# Patient Record
Sex: Female | Born: 1980 | Race: Black or African American | Hispanic: No | Marital: Single | Smoking: Never smoker
Health system: Southern US, Community
[De-identification: ages and names within clinical notes are randomized; demographics above are authoritative.]

---

## 2005-08-05 ENCOUNTER — Encounter: Payer: Self-pay | Admitting: Internal Medicine

## 2005-08-06 ENCOUNTER — Encounter: Payer: Self-pay | Admitting: Internal Medicine

## 2005-08-15 ENCOUNTER — Ambulatory Visit: Payer: Self-pay | Admitting: Hospitalist

## 2006-05-20 ENCOUNTER — Encounter: Payer: Self-pay | Admitting: Internal Medicine

## 2006-05-29 ENCOUNTER — Ambulatory Visit: Payer: Self-pay | Admitting: *Deleted

## 2006-05-29 DIAGNOSIS — R1900 Intra-abdominal and pelvic swelling, mass and lump, unspecified site: Secondary | ICD-10-CM | POA: Insufficient documentation

## 2006-07-24 ENCOUNTER — Ambulatory Visit (HOSPITAL_COMMUNITY): Admission: RE | Admit: 2006-07-24 | Discharge: 2006-07-24 | Payer: Self-pay | Admitting: Pulmonary Disease

## 2006-08-07 ENCOUNTER — Ambulatory Visit: Payer: Self-pay | Admitting: *Deleted

## 2006-08-07 ENCOUNTER — Encounter: Payer: Self-pay | Admitting: Internal Medicine

## 2006-08-07 LAB — CONVERTED CEMR LAB
ALT: 9 units/L (ref 0–35)
AST: 11 units/L (ref 0–37)
Basophils Absolute: 0 10*3/uL (ref 0.0–0.1)
Basophils Relative: 0 % (ref 0–1)
CO2: 22 meq/L (ref 19–32)
Chloride: 108 meq/L (ref 96–112)
Eosinophils Relative: 1 % (ref 0–5)
LDH: 138 units/L (ref 94–250)
Lymphocytes Relative: 37 % (ref 12–46)
Neutro Abs: 4.8 10*3/uL (ref 1.7–7.7)
Platelets: 279 10*3/uL (ref 150–400)
RDW: 13.6 % (ref 11.5–14.0)
Sodium: 140 meq/L (ref 135–145)
Total Bilirubin: 1.1 mg/dL (ref 0.3–1.2)
Total Protein: 7.7 g/dL (ref 6.0–8.3)

## 2006-08-22 ENCOUNTER — Telehealth: Payer: Self-pay | Admitting: Internal Medicine

## 2016-08-24 ENCOUNTER — Emergency Department (HOSPITAL_BASED_OUTPATIENT_CLINIC_OR_DEPARTMENT_OTHER): Payer: BLUE CROSS/BLUE SHIELD

## 2016-08-24 ENCOUNTER — Emergency Department (HOSPITAL_BASED_OUTPATIENT_CLINIC_OR_DEPARTMENT_OTHER)
Admission: EM | Admit: 2016-08-24 | Discharge: 2016-08-25 | Disposition: A | Discharge status: Home / Self Care | Payer: BLUE CROSS/BLUE SHIELD | Attending: Emergency Medicine | Admitting: Emergency Medicine

## 2016-08-24 ENCOUNTER — Encounter (HOSPITAL_BASED_OUTPATIENT_CLINIC_OR_DEPARTMENT_OTHER): Payer: Self-pay

## 2016-08-24 DIAGNOSIS — R2241 Localized swelling, mass and lump, right lower limb: Secondary | ICD-10-CM | POA: Insufficient documentation

## 2016-08-24 DIAGNOSIS — M79604 Pain in right leg: Secondary | ICD-10-CM | POA: Diagnosis not present

## 2016-08-24 DIAGNOSIS — M791 Myalgia, unspecified site: Secondary | ICD-10-CM

## 2016-08-24 DIAGNOSIS — M79601 Pain in right arm: Secondary | ICD-10-CM | POA: Insufficient documentation

## 2016-08-24 LAB — CBC WITH DIFFERENTIAL/PLATELET
BASOS ABS: 0 10*3/uL (ref 0.0–0.1)
Basophils Relative: 0 %
EOS PCT: 1 %
Eosinophils Absolute: 0.1 10*3/uL (ref 0.0–0.7)
HEMATOCRIT: 32.7 % — AB (ref 36.0–46.0)
Hemoglobin: 11.4 g/dL — ABNORMAL LOW (ref 12.0–15.0)
LYMPHS ABS: 3.3 10*3/uL (ref 0.7–4.0)
LYMPHS PCT: 42 %
MCH: 27.6 pg (ref 26.0–34.0)
MCHC: 34.9 g/dL (ref 30.0–36.0)
MCV: 79.2 fL (ref 78.0–100.0)
Monocytes Absolute: 0.5 10*3/uL (ref 0.1–1.0)
Monocytes Relative: 6 %
NEUTROS ABS: 4 10*3/uL (ref 1.7–7.7)
Neutrophils Relative %: 51 %
Platelets: 175 10*3/uL (ref 150–400)
RBC: 4.13 MIL/uL (ref 3.87–5.11)
RDW: 13.5 % (ref 11.5–15.5)
WBC: 7.9 10*3/uL (ref 4.0–10.5)

## 2016-08-24 LAB — BASIC METABOLIC PANEL
ANION GAP: 8 (ref 5–15)
BUN: 11 mg/dL (ref 6–20)
CHLORIDE: 106 mmol/L (ref 101–111)
CO2: 24 mmol/L (ref 22–32)
Calcium: 8.5 mg/dL — ABNORMAL LOW (ref 8.9–10.3)
Creatinine, Ser: 0.87 mg/dL (ref 0.44–1.00)
GFR calc Af Amer: >60 mL/min (ref >60)
GFR calc non Af Amer: >60 mL/min (ref >60)
GLUCOSE: 91 mg/dL (ref 65–99)
POTASSIUM: 3.7 mmol/L (ref 3.5–5.1)
Sodium: 138 mmol/L (ref 135–145)

## 2016-08-24 LAB — D-DIMER, QUANTITATIVE: D-Dimer, Quant: 0.51 ug/mL-FEU — ABNORMAL HIGH (ref 0.00–0.50)

## 2016-08-24 NOTE — ED Triage Notes (Signed)
C/o pain to right arm and right leg x 5 days-denies injury-NAD-steady gait

## 2016-08-24 NOTE — ED Notes (Signed)
Patient transported to Ultrasound 

## 2016-08-24 NOTE — ED Provider Notes (Signed)
[facility]   CSN: [id number] Arrival date & time: 08/24/16  1922  By signing my name below, I, Rosario Adie, attest that this documentation has been prepared under the direction and in the presence of Shaune Pollack, MD. Electronically Signed: Rosario Adie, ED Scribe. 08/24/16. 9:11 PM.  History   Chief Complaint Chief Complaint  Patient presents with  . Arm Pain  . Leg Pain   The history is provided by the patient. No language interpreter was used.    HPI Comments: Jeanette Patterson is a 36 y.o. female with no pertinent PMHx,  who presents to the Emergency Department complaining of persistent right arm and right leg pain beginning five days ago. Pt reports that she noticed a bruise over the area last week and over the past five days she has had aching pain to the entire right leg and since last night she has had pain to the right arm as well. Additionally, she reports that she noticed her right ankle was somewhat swollen today. Pt denies any known insect, arachnid, or tick bites. No h/o similar symptoms. No noted treatments for her symptoms were tried prior to coming into the ED. No h/o PE/DVT, recent long travel, surgery, fracture, prolonged immobilization, or exogenous estrogen usage. No recent medication changes or supplement usage. No recent activity in the woods. She denies neck pain, back pain, fever, chills, numbness, weakness, paraesthesias, abdominal pain, nausea, vomiting, or any other associated symptoms.   History reviewed. No pertinent past medical history.  Patient Active Problem List   Diagnosis Date Noted  . ABDOMINAL MASS 05/29/2006   History reviewed. No pertinent surgical history.  OB History    No data available     Home Medications    Prior to Admission medications   Medication Sig Start Date End Date Taking? Authorizing Provider  naproxen (NAPROSYN) 375 MG tablet Take 1 tablet (375 mg total) by mouth 2 (two) times  daily with a meal. 08/25/16 09/01/16  Shaune Pollack, MD   Family History No family history on file.  Social History Social History  Substance Use Topics  . Smoking status: Never Smoker  . Smokeless tobacco: Never Used  . Alcohol use No   Allergies   Patient has no known allergies.  Review of Systems Review of Systems  Constitutional: Negative for chills and fever.  Gastrointestinal: Negative for abdominal pain, nausea and vomiting.  Musculoskeletal: Positive for arthralgias, joint swelling and myalgias. Negative for back pain and neck pain.  Skin: Positive for color change.  Neurological: Negative for weakness and numbness.  All other systems reviewed and are negative.  Physical Exam Updated Vital Signs BP 118/76 (BP Location: Right Arm)   Pulse 77   Temp 98.7 F (37.1 C) (Oral)   Resp 16   Ht 5\' 3"  (1.6 m)   Wt 63 kg (139 lb)   SpO2 100%   BMI 24.62 kg/m   Physical Exam  Constitutional: She appears well-developed and well-nourished. No distress.  HENT:  Head: Normocephalic and atraumatic.  Eyes: Conjunctivae are normal.  Neck: Normal range of motion.  Cardiovascular: Normal rate, regular rhythm and normal heart sounds.   No murmur heard. Pulmonary/Chest: Effort normal and breath sounds normal. No respiratory distress. She has no wheezes. She has no rales.  Abdominal: Soft. She exhibits no distension. There is no tenderness. There is no rebound and no guarding.  Musculoskeletal: Normal range of motion.  No joint swelling or erythema. Full painless ROM  of the BLE and BUE.   Neurological: She is alert.  Neurological Exam:  Mental Status: Alert and oriented to person, place, and time. Attention and concentration normal. Speech clear. Recent memory is intact. Cranial Nerves: Visual fields grossly intact. EOMI and PERRLA. No nystagmus noted. Facial sensation intact at forehead, maxillary cheek, and chin/mandible bilaterally. No facial asymmetry or weakness. Hearing  grossly normal. Uvula is midline, and palate elevates symmetrically. Normal SCM and trapezius strength. Tongue midline without fasciculations. Motor: Muscle strength 5/5 in proximal and distal UE and LE bilaterally. No pronator drift. Muscle tone normal. Reflexes: 2+ and symmetrical in all four extremities.  Sensation: Intact to light touch in upper and lower extremities distally bilaterally.  Gait: Normal without ataxia. Coordination: Normal FTN bilaterally.  Skin: No pallor.  Multiple diffuse hyperpigmented lesions and scars to BLE and arms. No lesions on the palms or soles.   Psychiatric: She has a normal mood and affect. Her behavior is normal.  Nursing note and vitals reviewed.  ED Treatments / Results  DIAGNOSTIC STUDIES: Oxygen Saturation is 100% on RA, normal by my interpretation.   COORDINATION OF CARE: 9:11 PM-Discussed next steps with pt. Pt verbalized understanding and is agreeable with the plan.   Labs (all labs ordered are listed, but only abnormal results are displayed) Labs Reviewed  CBC WITH DIFFERENTIAL/PLATELET - Abnormal; Notable for the following:       Result Value   Hemoglobin 11.4 (*)    HCT 32.7 (*)    All other components within normal limits  BASIC METABOLIC PANEL - Abnormal; Notable for the following:    Calcium 8.5 (*)    All other components within normal limits  D-DIMER, QUANTITATIVE (NOT AT [facility]) - Abnormal; Notable for the following:    D-Dimer, Quant 0.51 (*)    All other components within normal limits    EKG  EKG Interpretation None      Radiology [place] Venous Img Lower Unilateral Right  Result Date: 08/24/2016 CLINICAL DATA:  Initial evaluation for right leg pain for 1 week. EXAM: Right LOWER EXTREMITY VENOUS DOPPLER ULTRASOUND TECHNIQUE: Gray-scale sonography with graded compression, as well as color Doppler and duplex ultrasound were performed to evaluate the lower extremity deep venous systems from the level of the common femoral vein  and including the common femoral, femoral, profunda femoral, popliteal and calf veins including the posterior tibial, peroneal and gastrocnemius veins when visible. The superficial great saphenous vein was also interrogated. Spectral Doppler was utilized to evaluate flow at rest and with distal augmentation maneuvers in the common femoral, femoral and popliteal veins. COMPARISON:  None. FINDINGS: Contralateral Common Femoral Vein: Respiratory phasicity is normal and symmetric with the symptomatic side. No evidence of thrombus. Normal compressibility. Common Femoral Vein: No evidence of thrombus. Normal compressibility, respiratory phasicity and response to augmentation. Saphenofemoral Junction: No evidence of thrombus. Normal compressibility and flow on color Doppler imaging. Profunda Femoral Vein: No evidence of thrombus. Normal compressibility and flow on color Doppler imaging. Femoral Vein: No evidence of thrombus. Normal compressibility, respiratory phasicity and response to augmentation. Popliteal Vein: No evidence of thrombus. Normal compressibility, respiratory phasicity and response to augmentation. Calf Veins: No evidence of thrombus. Normal compressibility and flow on color Doppler imaging. Superficial Great Saphenous Vein: No evidence of thrombus. Normal compressibility and flow on color Doppler imaging. Venous Reflux:  None. Other Findings:  None. IMPRESSION: No evidence of DVT within the right lower extremity. Electronically Signed   By: Janell QuietBenjamin  McClintock M.D.  On: 08/24/2016 23:37   [place] Venous Img Upper Uni Right  Result Date: 08/24/2016 CLINICAL DATA:  Initial evaluation for right arm pain for 1 week. EXAM: Right UPPER EXTREMITY VENOUS DOPPLER ULTRASOUND TECHNIQUE: Gray-scale sonography with graded compression, as well as color Doppler and duplex ultrasound were performed to evaluate the upper extremity deep venous system from the level of the subclavian vein and including the jugular, axillary,  basilic, radial, ulnar and upper cephalic vein. Spectral Doppler was utilized to evaluate flow at rest and with distal augmentation maneuvers. COMPARISON:  None. FINDINGS: Contralateral Subclavian Vein: Respiratory phasicity is normal and symmetric with the symptomatic side. No evidence of thrombus. Normal compressibility. Internal Jugular Vein: No evidence of thrombus. Normal compressibility, respiratory phasicity and response to augmentation. Subclavian Vein: No evidence of thrombus. Normal compressibility, respiratory phasicity and response to augmentation. Axillary Vein: No evidence of thrombus. Normal compressibility, respiratory phasicity and response to augmentation. Cephalic Vein: No evidence of thrombus. Normal compressibility, respiratory phasicity and response to augmentation. Basilic Vein: No evidence of thrombus. Normal compressibility, respiratory phasicity and response to augmentation. Brachial Veins: No evidence of thrombus. Normal compressibility, respiratory phasicity and response to augmentation. Radial Veins: No evidence of thrombus. Normal compressibility, respiratory phasicity and response to augmentation. Ulnar Veins: No evidence of thrombus. Normal compressibility, respiratory phasicity and response to augmentation. Venous Reflux:  None visualized. Other Findings:  None visualized. IMPRESSION: No evidence of DVT within the right upper extremity. Electronically Signed   By: Rise MuBenjamin  McClintock M.D.   On: 08/24/2016 23:38    Procedures Procedures   Medications Ordered in ED Medications - No data to display  Initial Impression / Assessment and Plan / ED Course  I have reviewed the triage vital signs and the nursing notes.  Pertinent labs & imaging results that were available during my care of the patient were reviewed by me and considered in my medical decision making (see chart for details).     36 yo F here with right-sided arthralgias and subjective leg swelling. Exam as  above, largely unremarkable. May be 2/2 MSK pain. CBC, BMP unremarkable. Pt did recently give birth however and D-Dimer positive, so [place] obtained of RUE and RLE and is negative. Doubt PE. Peripheral pulses and neuro function/sensation are normal. Will d/c with supportive care, return precautions.  Final Clinical Impressions(s) / ED Diagnoses   Final diagnoses:  Right arm pain  Right leg pain  Myalgia   New Prescriptions Discharge Medication List as of 08/25/2016 12:04 AM    START taking these medications   Details  naproxen (NAPROSYN) 375 MG tablet Take 1 tablet (375 mg total) by mouth 2 (two) times daily with a meal., Starting Sat 08/25/2016, Until Sat 09/01/2016, Print        I personally performed the services described in this documentation, which was scribed in my presence. The recorded information has been reviewed and is accurate.    Shaune PollackIsaacs, Tarry Blayney, MD 08/25/16 [phone]

## 2016-08-25 MED ORDER — NAPROXEN 375 MG PO TABS: 375.0000 mg | ORAL_TABLET | Freq: Two times a day (BID) | ORAL | 0 refills | Status: AC

## 2016-08-25 NOTE — ED Notes (Signed)
Pt is breastfeeding, MD made aware. Pt is to use ibuprofen 600mg  every 8 hours for pain per Dr. Erma HeritageIsaacs. Pt verbalized understanding.

## 2016-08-25 NOTE — ED Notes (Signed)
ED Provider at bedside. 

## 2019-06-22 IMAGING — US US EXTREM LOW VENOUS*R*
1 series · 13 of 24 positions shown · non-contrast
Comparison: None.

CLINICAL DATA: Initial evaluation for right leg pain for 1 week.



[Series 1: us extrem low venous*right* · 0.05mm/px · 13 of 26 slices shown]
[im 1/26]
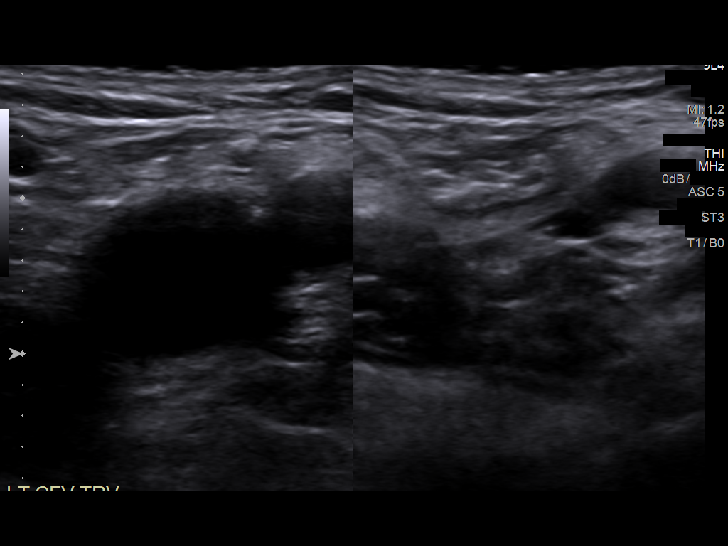
[im 3/26]
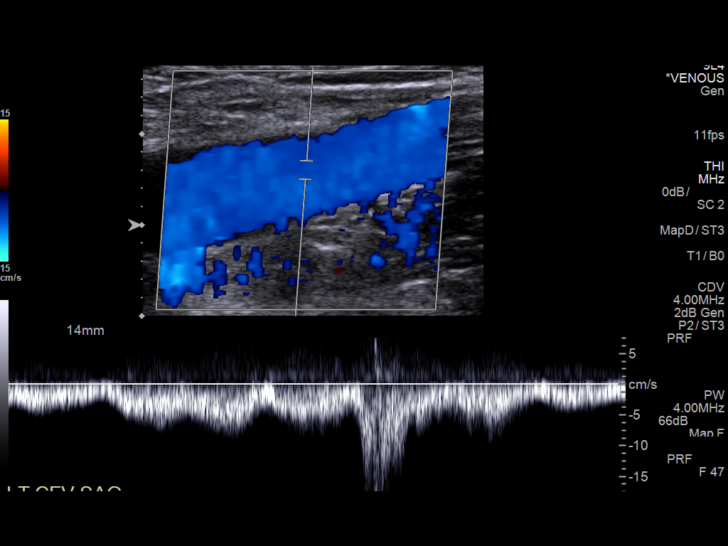
[im 5/26]
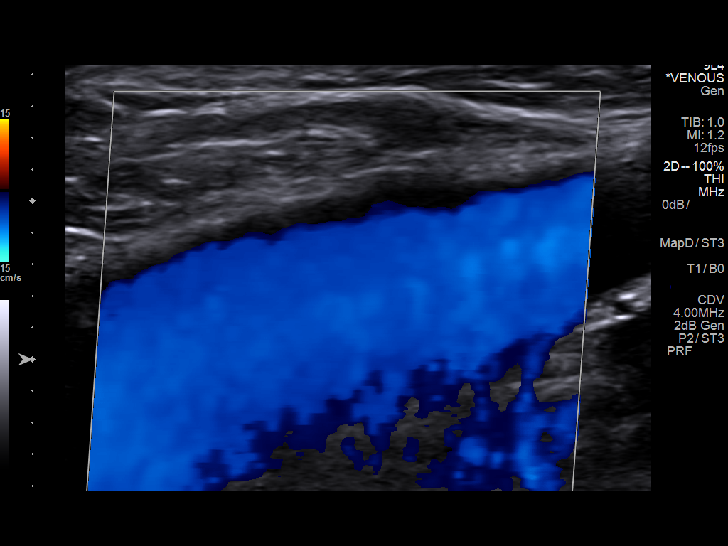
[im 7/26]
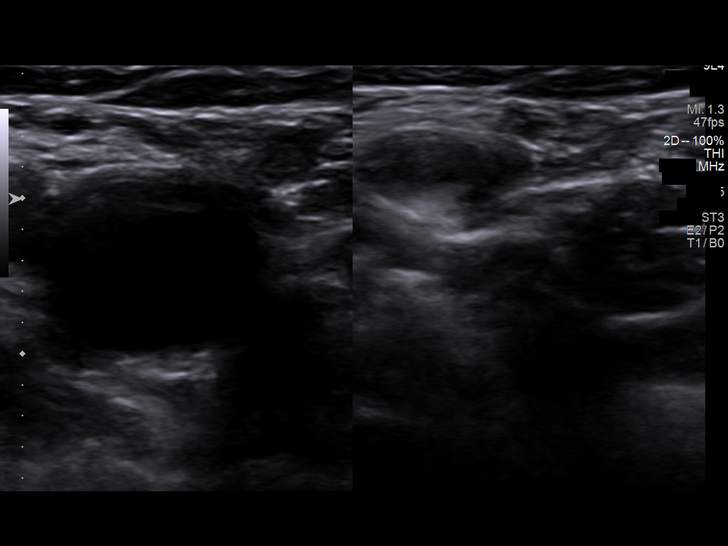
[im 9/26]
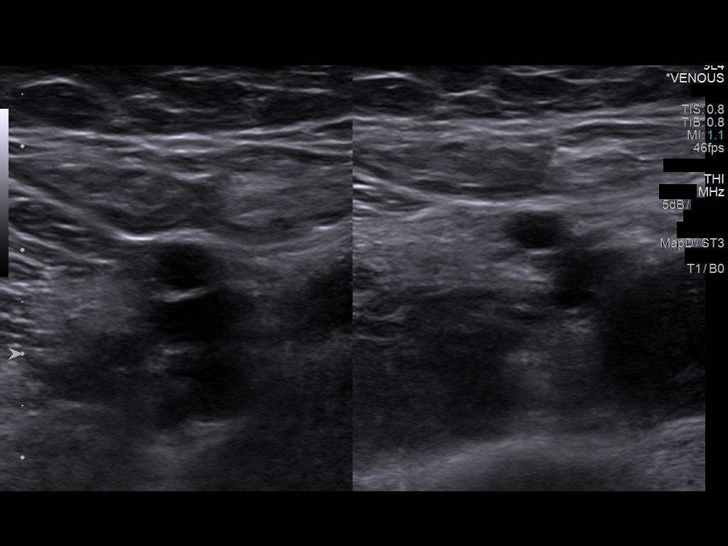
[im 11/26]
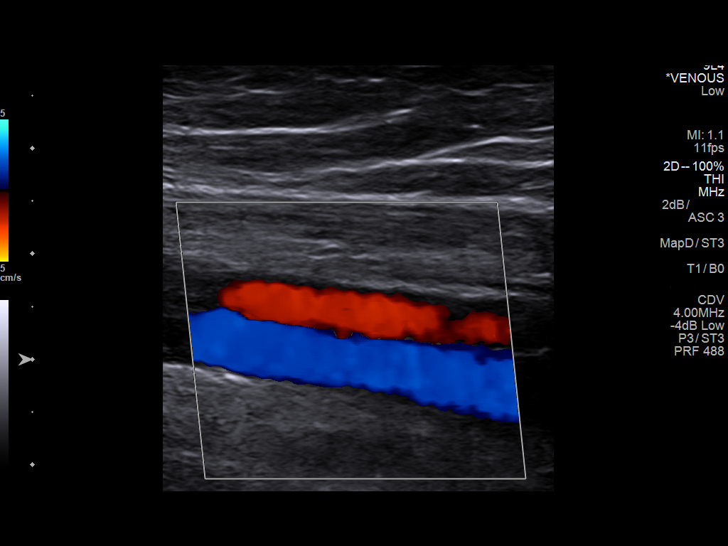
[im 14/26]
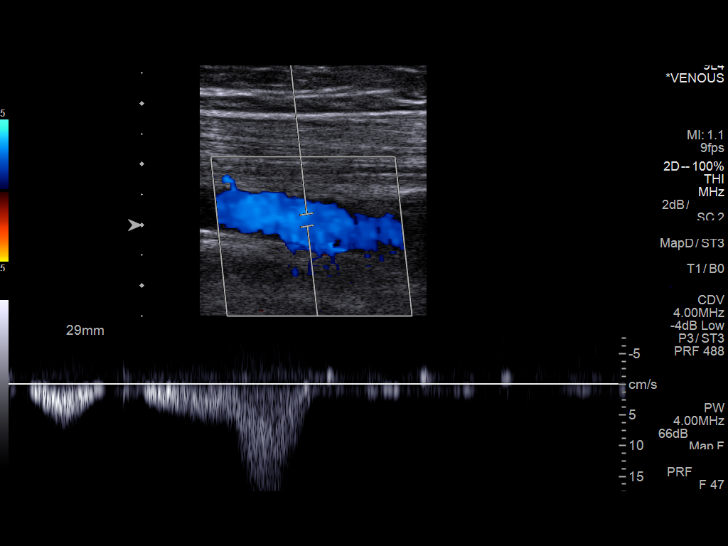
[im 15/26]
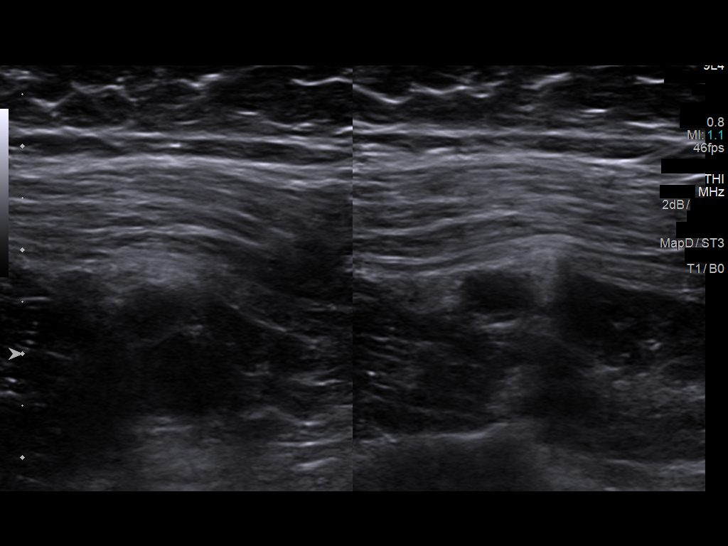
[im 17/26]
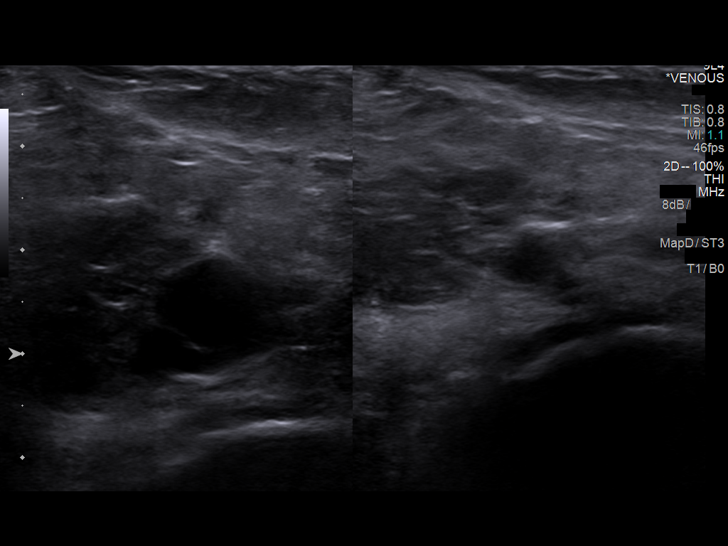
[im 19/26]
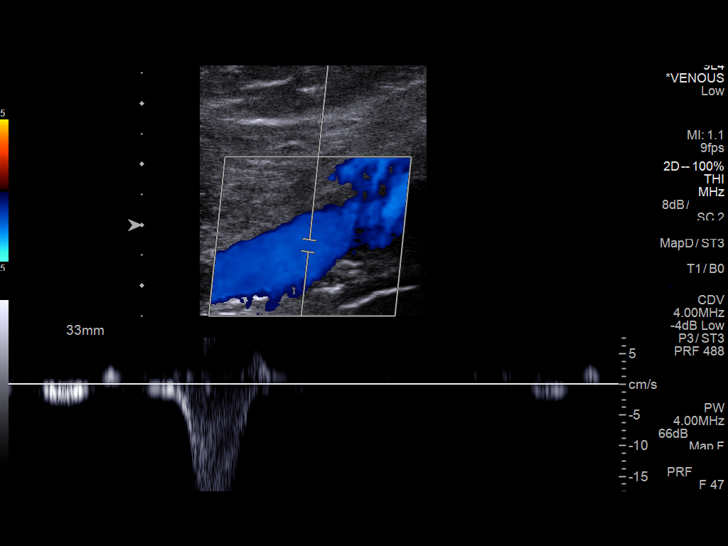
[im 21/26]
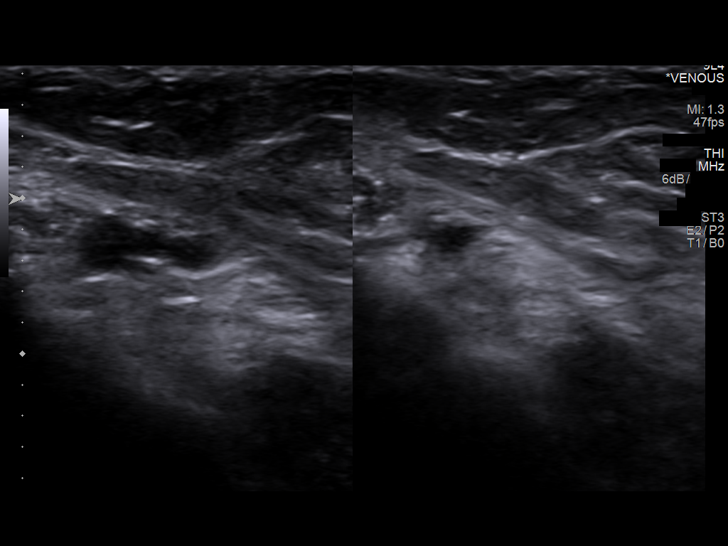
[im 23/26]
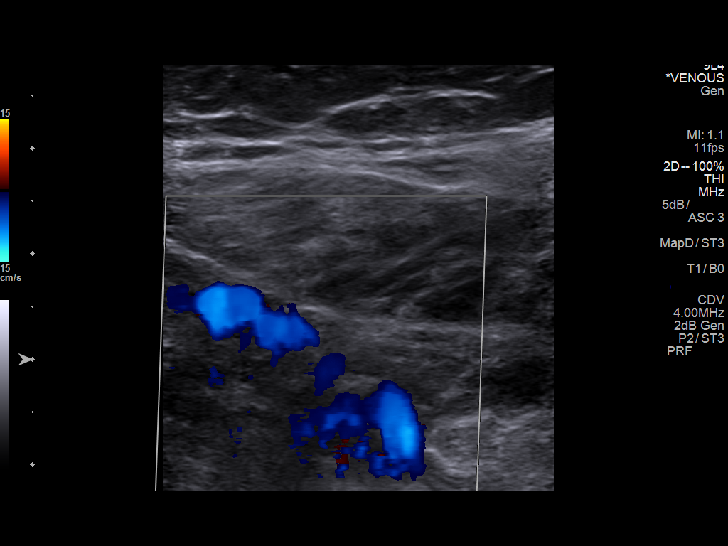
[im 26/26]
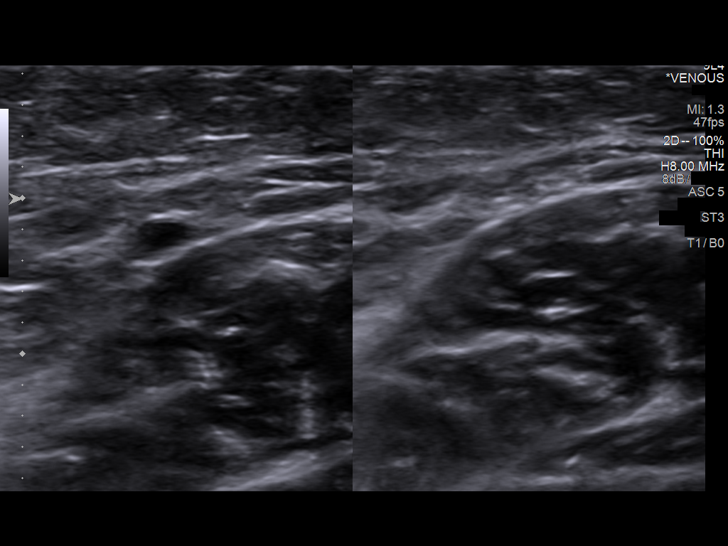

[13 of 24 positions shown; findings below may reference images not displayed]

FINDINGS: Contralateral Common Femoral Vein: Respiratory phasicity is normal
and symmetric with the symptomatic side. No evidence of thrombus.
Normal compressibility.

Common Femoral Vein: No evidence of thrombus. Normal
compressibility, respiratory phasicity and response to augmentation.

Saphenofemoral Junction: No evidence of thrombus. Normal
compressibility and flow on color Doppler imaging.

Profunda Femoral Vein: No evidence of thrombus. Normal
compressibility and flow on color Doppler imaging.

Femoral Vein: No evidence of thrombus. Normal compressibility,
respiratory phasicity and response to augmentation.

Popliteal Vein: No evidence of thrombus. Normal compressibility,
respiratory phasicity and response to augmentation.

Calf Veins: No evidence of thrombus. Normal compressibility and flow
on color Doppler imaging.

Superficial Great Saphenous Vein: No evidence of thrombus. Normal
compressibility and flow on color Doppler imaging.

Venous Reflux:  None.

Other Findings:  None.
IMPRESSION: No evidence of DVT within the right lower extremity.

## 2019-06-22 IMAGING — US US EXTREM  UP VENOUS*R*
1 series · 13 of 24 positions shown · non-contrast
Comparison: None.

CLINICAL DATA: Initial evaluation for right arm pain for 1 week.



[Series 1: us extrem up venous*right* · 0.06mm/px · 13 of 32 slices shown]
[im 1/32]
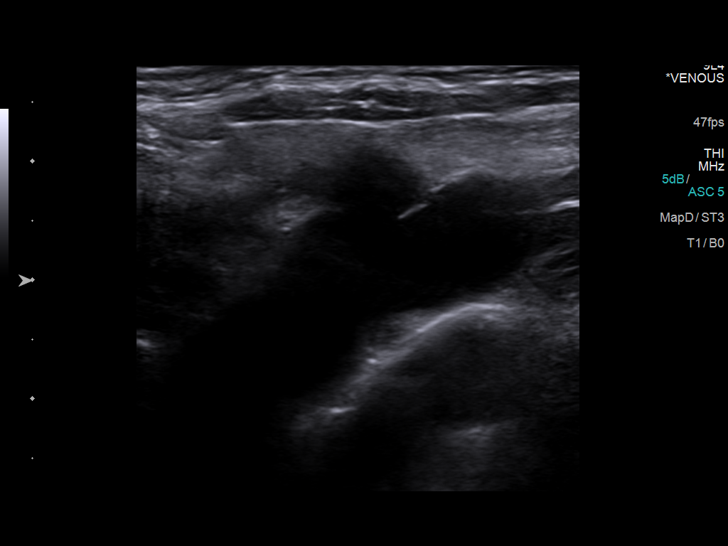
[im 3/32]
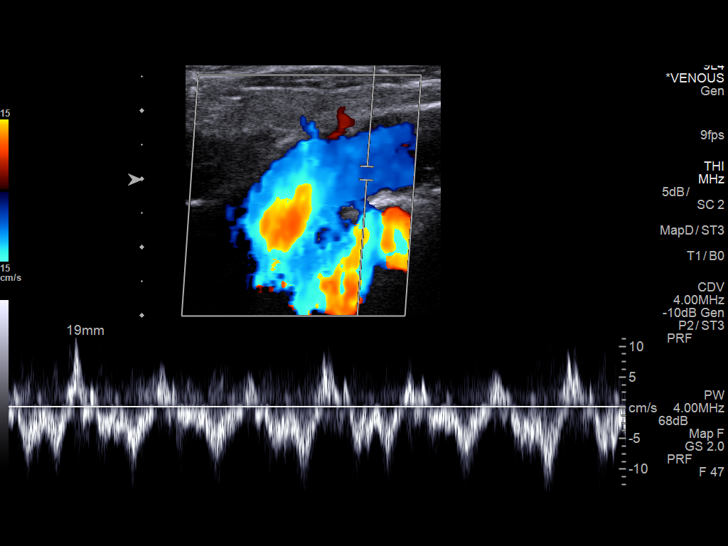
[im 6/32]
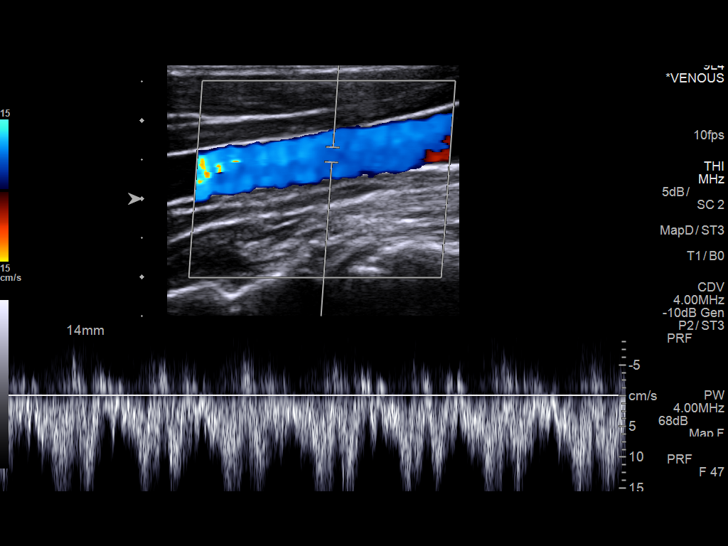
[im 9/32]
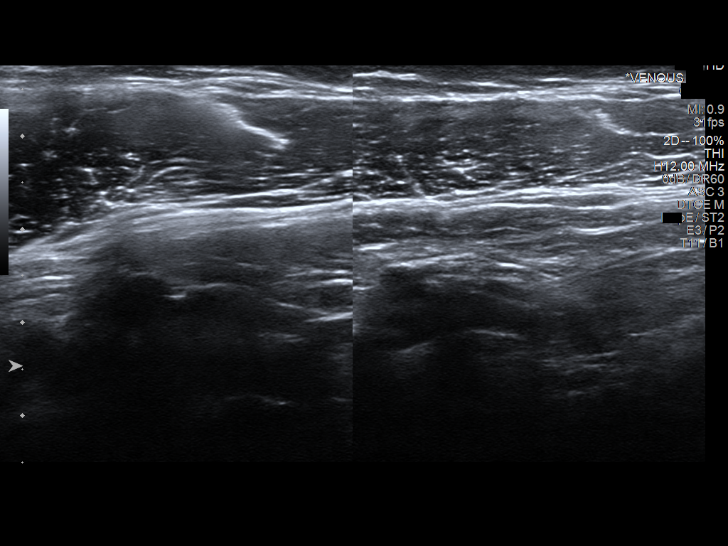
[im 11/32]
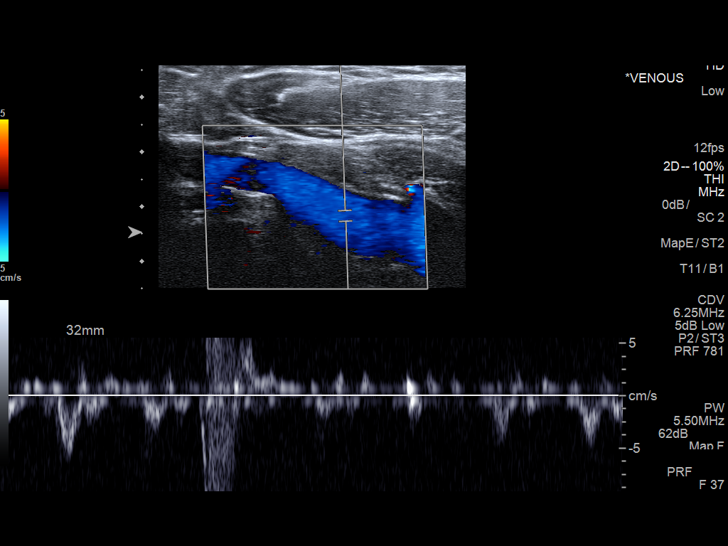
[im 14/32]
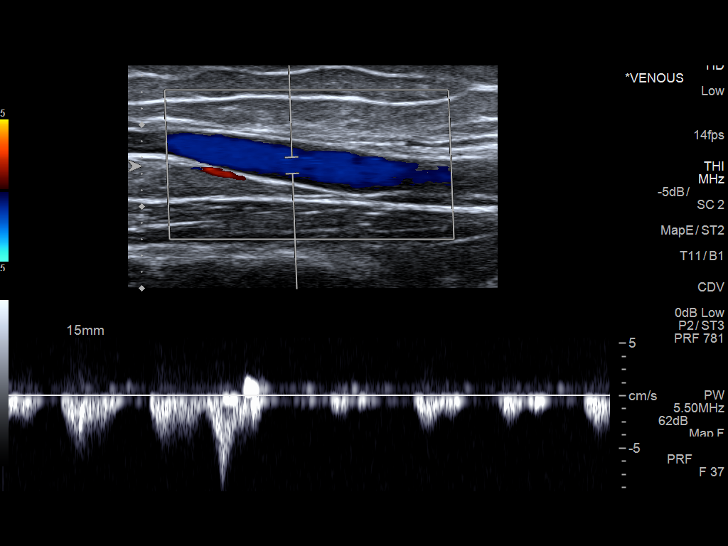
[im 17/32]
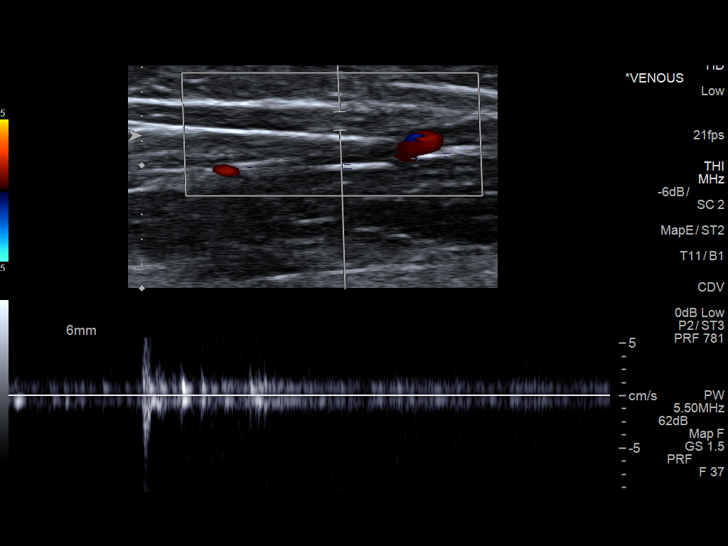
[im 18/32]
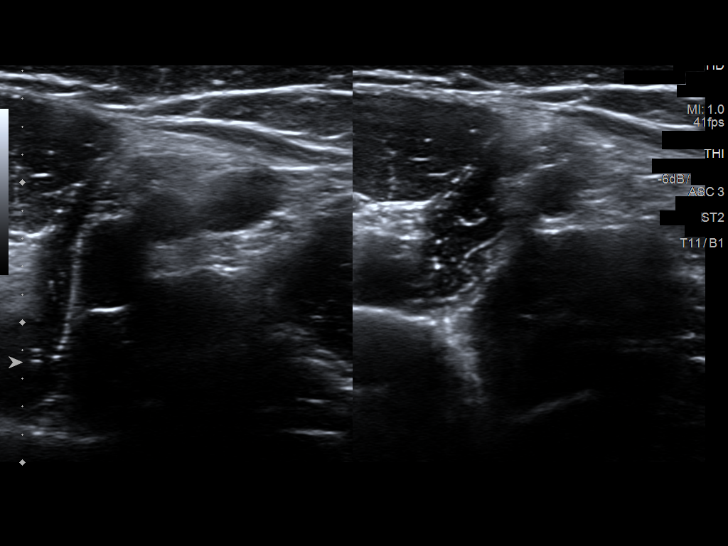
[im 21/32]
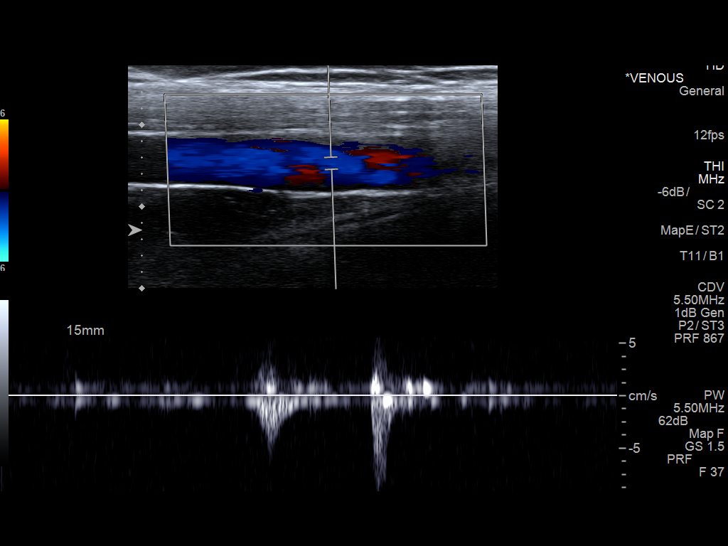
[im 23/32]
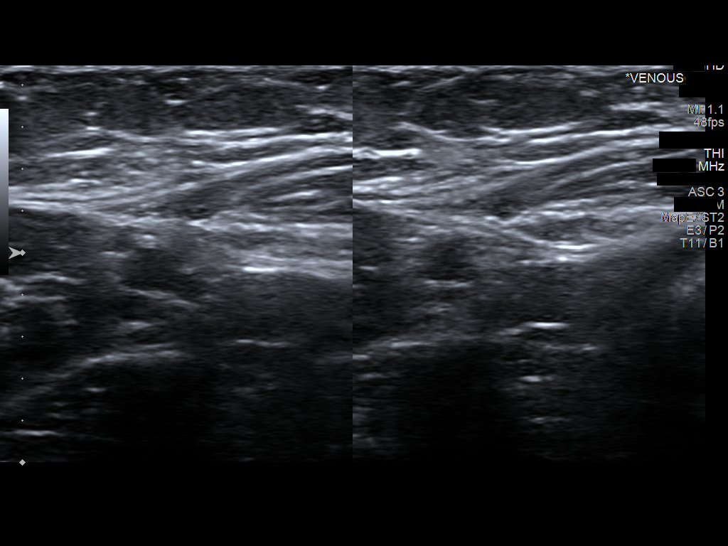
[im 26/32]
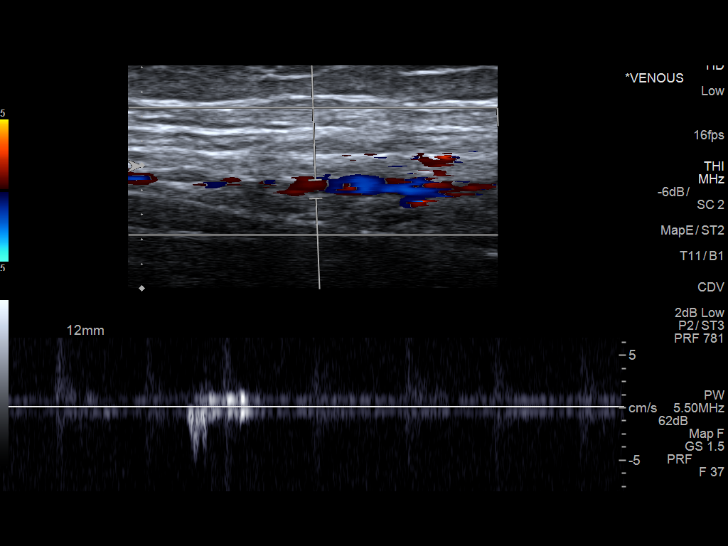
[im 29/32]
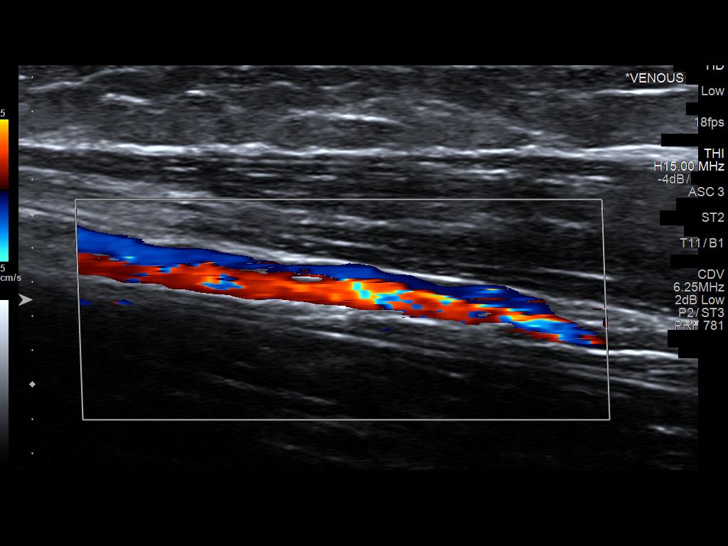
[im 32/32]
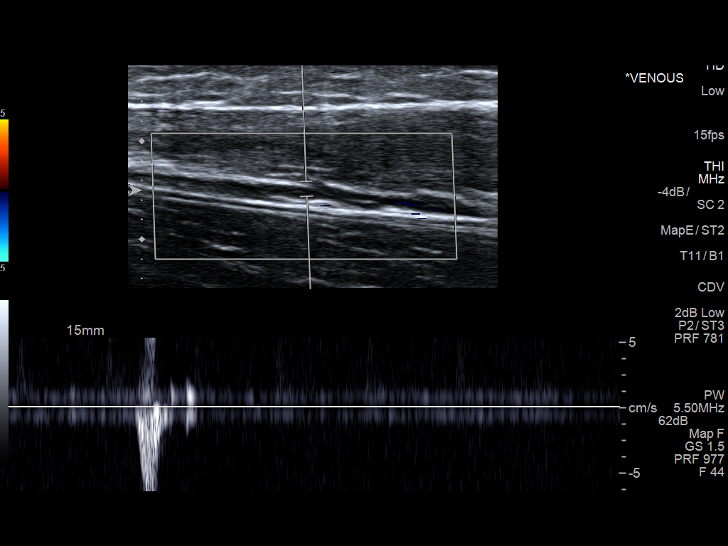

[13 of 24 positions shown; findings below may reference images not displayed]

FINDINGS: Contralateral Subclavian Vein: Respiratory phasicity is normal and
symmetric with the symptomatic side. No evidence of thrombus. Normal
compressibility.

Internal Jugular Vein: No evidence of thrombus. Normal
compressibility, respiratory phasicity and response to augmentation.

Subclavian Vein: No evidence of thrombus. Normal compressibility,
respiratory phasicity and response to augmentation.

Axillary Vein: No evidence of thrombus. Normal compressibility,
respiratory phasicity and response to augmentation.

Cephalic Vein: No evidence of thrombus. Normal compressibility,
respiratory phasicity and response to augmentation.

Basilic Vein: No evidence of thrombus. Normal compressibility,
respiratory phasicity and response to augmentation.

Brachial Veins: No evidence of thrombus. Normal compressibility,
respiratory phasicity and response to augmentation.

Radial Veins: No evidence of thrombus. Normal compressibility,
respiratory phasicity and response to augmentation.

Ulnar Veins: No evidence of thrombus. Normal compressibility,
respiratory phasicity and response to augmentation.

Venous Reflux:  None visualized.

Other Findings:  None visualized.
IMPRESSION: No evidence of DVT within the right upper extremity.

## 2021-04-14 ENCOUNTER — Other Ambulatory Visit: Payer: Self-pay

## 2021-04-14 ENCOUNTER — Encounter (HOSPITAL_BASED_OUTPATIENT_CLINIC_OR_DEPARTMENT_OTHER): Payer: Self-pay

## 2021-04-14 ENCOUNTER — Emergency Department (HOSPITAL_BASED_OUTPATIENT_CLINIC_OR_DEPARTMENT_OTHER): Payer: 59

## 2021-04-14 ENCOUNTER — Emergency Department (HOSPITAL_BASED_OUTPATIENT_CLINIC_OR_DEPARTMENT_OTHER)
Admission: EM | Admit: 2021-04-14 | Discharge: 2021-04-14 | Disposition: A | Discharge status: Home / Self Care | Payer: 59 | Attending: Emergency Medicine | Admitting: Emergency Medicine

## 2021-04-14 DIAGNOSIS — R1084 Generalized abdominal pain: Secondary | ICD-10-CM | POA: Insufficient documentation

## 2021-04-14 LAB — URINALYSIS, MICROSCOPIC (REFLEX)

## 2021-04-14 LAB — CBC WITH DIFFERENTIAL/PLATELET
Abs Immature Granulocytes: 0.01 10*3/uL (ref 0.00–0.07)
Basophils Absolute: 0 10*3/uL (ref 0.0–0.1)
Basophils Relative: 0 %
Eosinophils Absolute: 0.1 10*3/uL (ref 0.0–0.5)
Eosinophils Relative: 1 %
HCT: 32.7 % — ABNORMAL LOW (ref 36.0–46.0)
Hemoglobin: 11.1 g/dL — ABNORMAL LOW (ref 12.0–15.0)
Immature Granulocytes: 0 %
Lymphocytes Relative: 37 %
Lymphs Abs: 2.6 10*3/uL (ref 0.7–4.0)
MCH: 26.2 pg (ref 26.0–34.0)
MCHC: 33.9 g/dL (ref 30.0–36.0)
MCV: 77.1 fL — ABNORMAL LOW (ref 80.0–100.0)
Monocytes Absolute: 0.4 10*3/uL (ref 0.1–1.0)
Monocytes Relative: 5 %
Neutro Abs: 4 10*3/uL (ref 1.7–7.7)
Neutrophils Relative %: 57 %
Platelets: 227 10*3/uL (ref 150–400)
RBC: 4.24 MIL/uL (ref 3.87–5.11)
RDW: 13.5 % (ref 11.5–15.5)
Smear Review: NORMAL
WBC: 7.1 10*3/uL (ref 4.0–10.5)
nRBC: 0 % (ref 0.0–0.2)

## 2021-04-14 LAB — URINALYSIS, ROUTINE W REFLEX MICROSCOPIC
Bilirubin Urine: NEGATIVE
Glucose, UA: NEGATIVE mg/dL
Ketones, ur: NEGATIVE mg/dL
Leukocytes,Ua: NEGATIVE
Nitrite: NEGATIVE
Protein, ur: NEGATIVE mg/dL
Specific Gravity, Urine: 1.01 (ref 1.005–1.030)
pH: 6.5 (ref 5.0–8.0)

## 2021-04-14 LAB — COMPREHENSIVE METABOLIC PANEL
ALT: 26 U/L (ref 0–44)
AST: 22 U/L (ref 15–41)
Albumin: 3.4 g/dL — ABNORMAL LOW (ref 3.5–5.0)
Alkaline Phosphatase: 45 U/L (ref 38–126)
Anion gap: 9 (ref 5–15)
BUN: 8 mg/dL (ref 6–20)
CO2: 24 mmol/L (ref 22–32)
Calcium: 8.8 mg/dL — ABNORMAL LOW (ref 8.9–10.3)
Chloride: 105 mmol/L (ref 98–111)
Creatinine, Ser: 0.79 mg/dL (ref 0.44–1.00)
GFR, Estimated: >60 mL/min (ref >60)
Glucose, Bld: 88 mg/dL (ref 70–99)
Potassium: 3.1 mmol/L — ABNORMAL LOW (ref 3.5–5.1)
Sodium: 138 mmol/L (ref 135–145)
Total Bilirubin: 0.8 mg/dL (ref 0.3–1.2)
Total Protein: 7.5 g/dL (ref 6.5–8.1)

## 2021-04-14 LAB — PREGNANCY, URINE: Preg Test, Ur: NEGATIVE

## 2021-04-14 LAB — LIPASE, BLOOD: Lipase: 36 U/L (ref 11–51)

## 2021-04-14 MED ORDER — ONDANSETRON 4 MG PO TBDP: 4.0000 mg | ORAL_TABLET | Freq: Three times a day (TID) | ORAL | 0 refills | Status: AC | PRN

## 2021-04-14 MED ORDER — IOHEXOL 350 MG/ML SOLN
100.0000 mL | Freq: Once | INTRAVENOUS | Status: AC | PRN
  Administered 2021-04-14: 100 mL via INTRAVENOUS

## 2021-04-14 MED ORDER — ONDANSETRON HCL 4 MG/2ML IJ SOLN: 4.0000 mg | Freq: Once | INTRAMUSCULAR | Status: DC

## 2021-04-14 MED ORDER — DICYCLOMINE HCL 20 MG PO TABS: 20.0000 mg | ORAL_TABLET | Freq: Two times a day (BID) | ORAL | 0 refills | Status: AC

## 2021-04-14 MED ORDER — POTASSIUM CHLORIDE CRYS ER 20 MEQ PO TBCR: 40.0000 meq | EXTENDED_RELEASE_TABLET | Freq: Two times a day (BID) | ORAL | 0 refills | Status: AC

## 2021-04-14 MED ORDER — SODIUM CHLORIDE 0.9 % IV BOLUS: 1000.0000 mL | Freq: Once | INTRAVENOUS | Status: DC

## 2021-04-14 NOTE — ED Triage Notes (Signed)
Pt c/o right side abd pain, n/d x 6 days-NAD-steady gait ?

## 2021-04-14 NOTE — ED Provider Notes (Signed)
?[facility] EMERGENCY DEPARTMENT ?Provider Note ? ? ?CSN: [id number] ?Arrival date & time: 04/14/21  1608 ? ?  ? ?History ? ?Chief Complaint  ?Patient presents with  ? Abdominal Pain  ? ? ?Jeanette Patterson is a 41 y.o. female. ? ?41 year old female presents today for evaluation of right-sided abdominal pain onset Saturday.  Patient reports its been constant since.  Denies any alleviating or aggravating factors.  She has not tried anything prior to her arrival.  She endorses nausea however denies vomiting.  She denies any changes to her bowel habits.  She denies any recent illness.  She states she has not been sexually active for at least a month.  Denies any new vaginal discharge or vaginal bleeding.  She denies dysuria.  Denies prior abdominal surgeries. ? ?The history is provided by the patient. No language interpreter was used.  ? ?  ? ?Home Medications ?Prior to Admission medications   ?Not on File  ?   ? ?Allergies    ?Patient has no known allergies.   ? ?Review of Systems   ?Review of Systems  ?Constitutional:  Negative for activity change, chills and fever.  ?Respiratory:  Negative for shortness of breath.   ?Cardiovascular:  Negative for chest pain.  ?Gastrointestinal:  Positive for abdominal pain and nausea. Negative for abdominal distention and vomiting.  ?Genitourinary:  Negative for dysuria.  ?Neurological:  Negative for weakness and light-headedness.  ?All other systems reviewed and are negative. ? ?Physical Exam ?Updated Vital Signs ?BP (!) 153/98 (BP Location: Left Arm)   Pulse 88   Temp 98.9 ?F (37.2 ?C) (Oral)   Resp 20   Ht 5\' 3"  (1.6 m)   Wt 68.5 kg   LMP 04/06/2021   SpO2 100%   BMI 26.75 kg/m?  ?Physical Exam ?Vitals and nursing note reviewed.  ?Constitutional:   ?   General: She is not in acute distress. ?   Appearance: Normal appearance. She is well-developed. She is not ill-appearing.  ?HENT:  ?   Head: Normocephalic and atraumatic.  ?   Nose: Nose normal.  ?Eyes:  ?    General: No scleral icterus. ?   Extraocular Movements: Extraocular movements intact.  ?   Conjunctiva/sclera: Conjunctivae normal.  ?Cardiovascular:  ?   Rate and Rhythm: Normal rate and regular rhythm.  ?   Pulses: Normal pulses.  ?   Heart sounds: Normal heart sounds.  ?Pulmonary:  ?   Effort: Pulmonary effort is normal. No respiratory distress.  ?   Breath sounds: Normal breath sounds. No wheezing or rales.  ?Abdominal:  ?   General: There is no distension.  ?   Palpations: Abdomen is soft.  ?   Tenderness: There is no abdominal tenderness. There is no right CVA tenderness, left CVA tenderness or guarding.  ?Musculoskeletal:     ?   General: Normal range of motion.  ?   Cervical back: Normal range of motion.  ?Skin: ?   General: Skin is warm and dry.  ?Neurological:  ?   General: No focal deficit present.  ?   Mental Status: She is alert. Mental status is at baseline.  ? ? ?ED Results / Procedures / Treatments   ?Labs ?(all labs ordered are listed, but only abnormal results are displayed) ?Labs Reviewed  ?COMPREHENSIVE METABOLIC PANEL - Abnormal; Notable for the following components:  ?    Result Value  ? Potassium 3.1 (*)   ? Calcium 8.8 (*)   ? Albumin  3.4 (*)   ? All other components within normal limits  ?URINALYSIS, ROUTINE W REFLEX MICROSCOPIC - Abnormal; Notable for the following components:  ? Hgb urine dipstick TRACE (*)   ? All other components within normal limits  ?URINALYSIS, MICROSCOPIC (REFLEX) - Abnormal; Notable for the following components:  ? Bacteria, UA FEW (*)   ? All other components within normal limits  ?CBC WITH DIFFERENTIAL/PLATELET - Abnormal; Notable for the following components:  ? Hemoglobin 11.1 (*)   ? HCT 32.7 (*)   ? MCV 77.1 (*)   ? All other components within normal limits  ?LIPASE, BLOOD  ?PREGNANCY, URINE  ? ? ?EKG ?None ? ?Radiology ?No results found. ? ?Procedures ?Procedures  ? ? ?Medications Ordered in ED ?Medications  ?sodium chloride 0.9 % bolus 1,000 mL (has no  administration in time range)  ?ondansetron (ZOFRAN) injection 4 mg (has no administration in time range)  ? ? ?ED Course/ Medical Decision Making/ A&P ?  ?                        ?Medical Decision Making ?Amount and/or Complexity of Data Reviewed ?Labs: ordered. ?Radiology: ordered. ? ?Risk ?Prescription drug management. ? ? ?Medical Decision Making / ED Course ? ? ?This patient presents to the ED for concern of abdominal pain, this involves an extensive number of treatment options, and is a complaint that carries with it a high risk of complications and morbidity.  The differential diagnosis includes appendicitis, cholecystitis, pancreatitis, diverticulitis, colitis ? ?MDM: ?41 year old female presents today for evaluation of right-sided abdominal pain onset Saturday.  Its been constant without any alleviating or aggravating factors.  She has not taken anything prior to arrival.  Denies any changes to her bowel habits.  Endorses nausea without vomiting.  Abdomen is nondistended and nontender.  Work-up is without leukocytosis.  Potassium 3.1.  Will replete with 40 mEq p.o. potassium.  UA without UTI.  CT abdomen pelvis ordered.  IV fluids, Zofran ordered. ?CT abdomen pelvis without acute intra-abdominal process.  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  Patient is aware of this as well and states it was also visible on a recent ultrasound done by PCP.  She has not received any diagnosis or any official work-up from the standpoint.  She reports some improvement in her symptoms.  Patient is appropriate for discharge.  Discharged in stable condition.  Return precautions discussed. ? ? ?Lab Tests: ?-I ordered, reviewed, and interpreted labs.   ?The pertinent results include:   ?Labs Reviewed  ?COMPREHENSIVE METABOLIC PANEL - Abnormal; Notable for the following components:  ?    Result Value  ? Potassium 3.1 (*)   ? Calcium  8.8 (*)   ? Albumin 3.4 (*)   ? All other components within normal limits  ?URINALYSIS, ROUTINE W REFLEX MICROSCOPIC - Abnormal; Notable for the following components:  ? Hgb urine dipstick TRACE (*)   ? All other components within normal limits  ?URINALYSIS, MICROSCOPIC (REFLEX) - Abnormal; Notable for the following components:  ? Bacteria, UA FEW (*)   ? All other components within normal limits  ?CBC WITH DIFFERENTIAL/PLATELET - Abnormal; Notable for the following components:  ? Hemoglobin 11.1 (*)   ? HCT 32.7 (*)   ? MCV 77.1 (*)   ? All other components within normal limits  ?LIPASE, BLOOD  ?PREGNANCY, URINE  ?  ? ? ?  EKG ? EKG Interpretation ? ?Date/Time:    ?Ventricular Rate:    ?PR Interval:    ?QRS Duration:   ?QT Interval:    ?QTC Calculation:   ?R Axis:     ?Text Interpretation:   ?  ? ?  ? ? ? ?Imaging Studies ordered: ?I ordered imaging studies including CT abdomen pelvis with contrast ?I independently visualized and interpreted imaging. ?I agree with the radiologist interpretation ? ? ?Medicines ordered and prescription drug management: ?Meds ordered this encounter  ?Medications  ? sodium chloride 0.9 % bolus 1,000 mL  ? ondansetron (ZOFRAN) injection 4 mg  ?  ?-I have reviewed the patients home medicines and have made adjustments as needed ? ?Reevaluation: ?After the interventions noted above, I reevaluated the patient and found that they have :improved ? ?Co morbidities that complicate the patient evaluation ?History reviewed. No pertinent past medical history.  ? ? ?Dispostion: ?Patient is appropriate for discharge.  Discharged in stable condition.  Return precautions discussed.  Discussed importance of follow-up with PCP regarding spleen mass.  Patient voices understanding.  ? ?Final Clinical Impression(s) / ED Diagnoses ?Final diagnoses:  ?Generalized abdominal pain  ? ? ?Rx / DC Orders ?ED Discharge Orders   ? ?      Ordered  ?  ondansetron (ZOFRAN-ODT) 4 MG disintegrating tablet  Every 8 hours  PRN       ? 04/14/21 2232  ?  dicyclomine (BENTYL) 20 MG tablet  2 times daily       ? 04/14/21 2232  ? ?  ?  ? ?  ? ? ?  ?Marita [place], PA-C ?04/14/21 2233 ? ?  ?Milagros Loll, MD ?04/15/21 1755 ? ?

## 2021-04-14 NOTE — ED Notes (Signed)
Pt states she feels fine right now.  ?

## 2021-04-14 NOTE — Discharge Instructions (Addendum)
Work-up today was reassuring.  You received IV fluids and nausea medication in the emergency room with some improvement in your symptoms.  Your CT scan did not show any concerning findings regarding your abdominal pain.  It did note a mass on your spleen.  This has been present for at least since 2008.  Follow-up with your primary care provider regarding this.  I have sent in Zofran and Bentyl to the pharmacy for you. ?

## 2022-12-16 IMAGING — CT CT ABD-PELV W/ CM
2 of 5 series · 15 of 46 positions shown, 17 images · IV contrast (Omnipaque)
Comparison: 07/24/2006

CLINICAL DATA: Acute abdominal pain

EXAM:
CT ABDOMEN AND PELVIS WITH CONTRAST
TECHNIQUE: Multidetector CT imaging of the abdomen and pelvis was performed
using the standard protocol following bolus administration of
intravenous contrast.

[Series 2: axial st · axial · 0.81mm/px · z∈[-446,-66]mm · 12 of 86 slices shown, 14 images]
[im 5/86  soft-tissue]
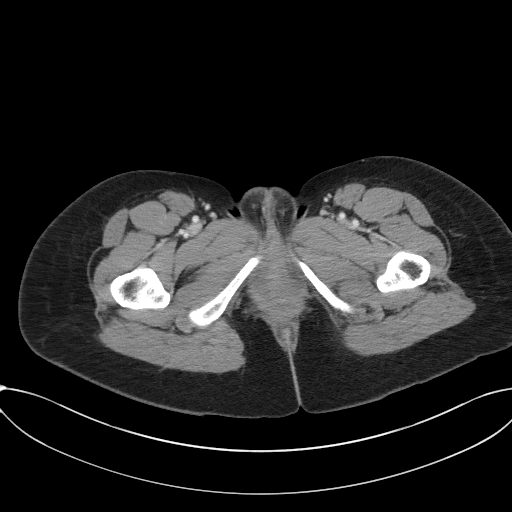
[im 5/86  bone]
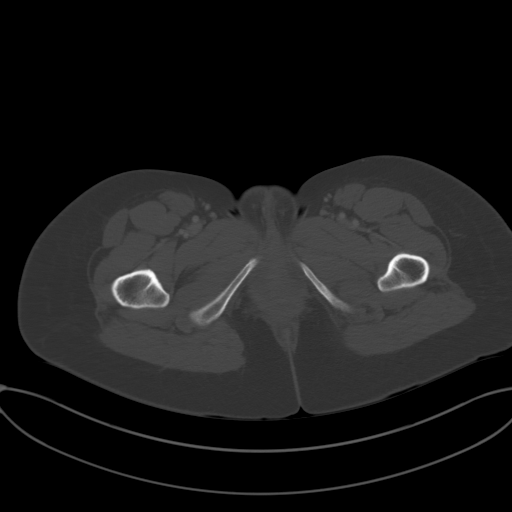
[im 13/86  soft-tissue]
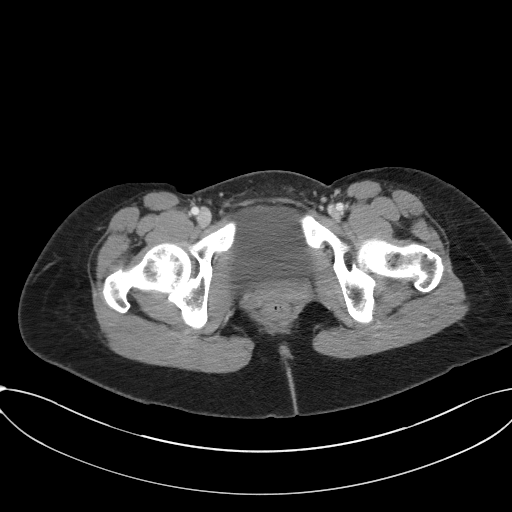
[im 18/86  soft-tissue]
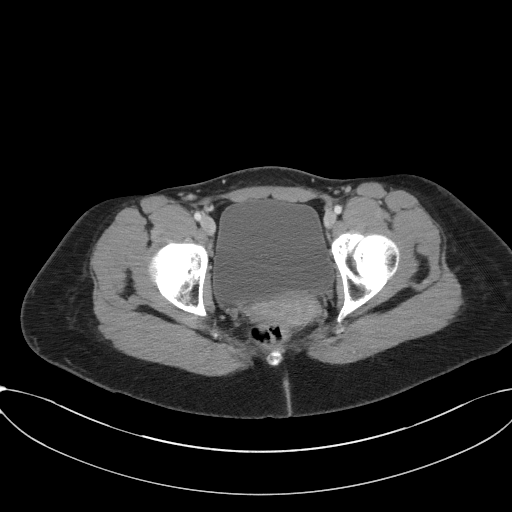
[im 26/86  soft-tissue]
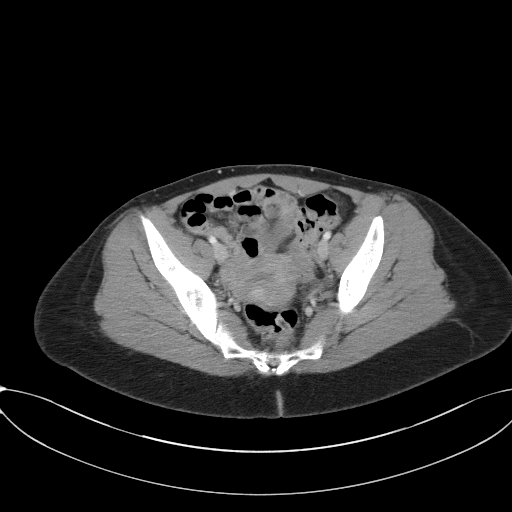
[im 35/86  soft-tissue]
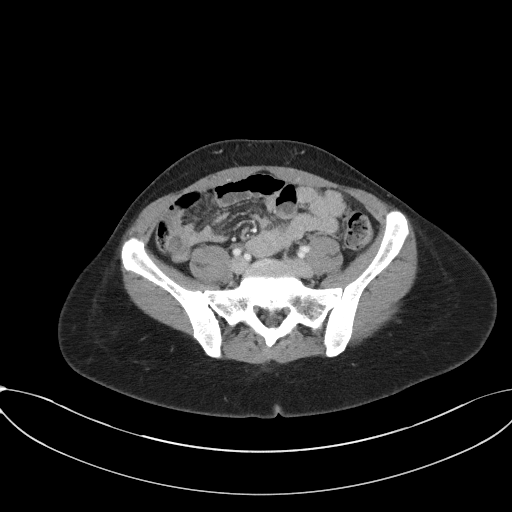
[im 39/86  soft-tissue]
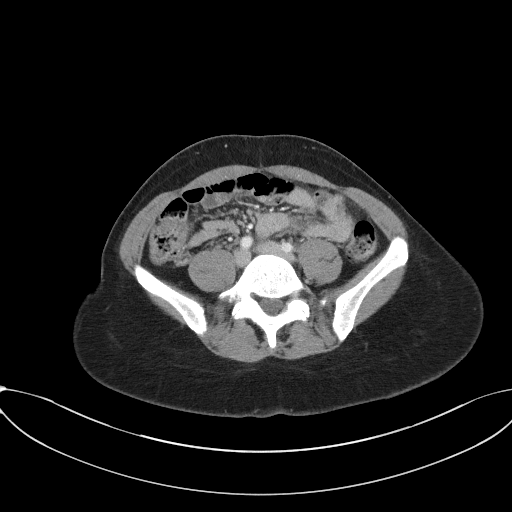
[im 47/86  soft-tissue]
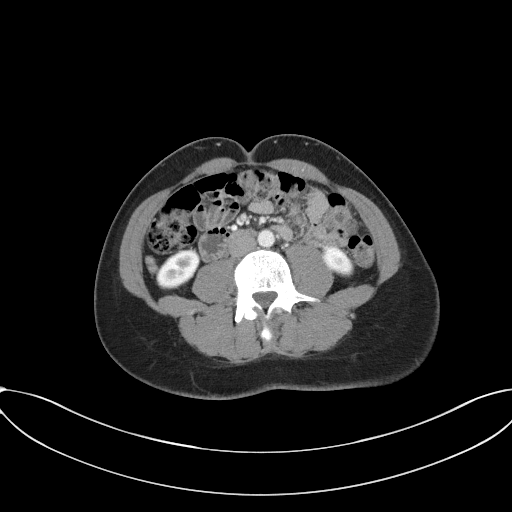
[im 52/86  soft-tissue]
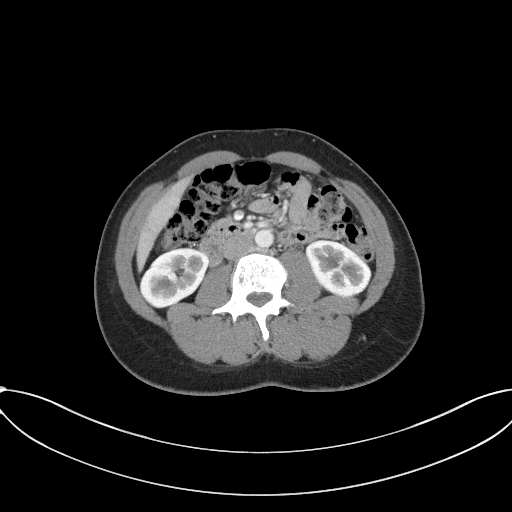
[im 60/86  soft-tissue]
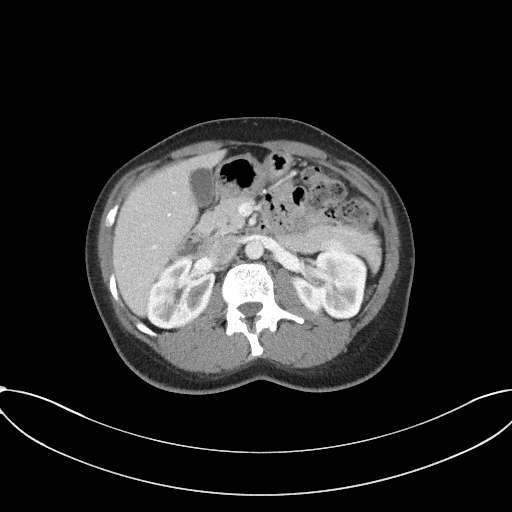
[im 60/86  bone]
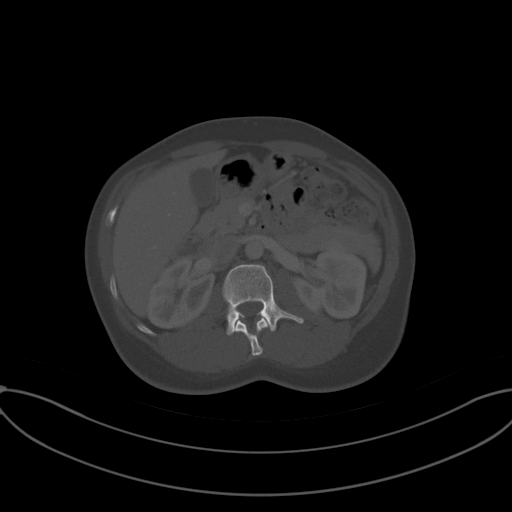
[im 69/86  soft-tissue]
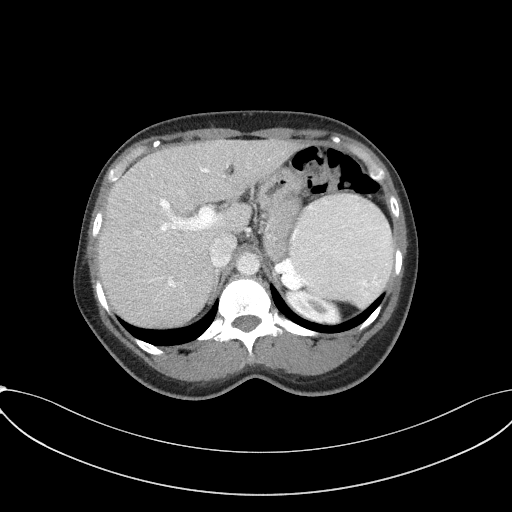
[im 73/86  soft-tissue]
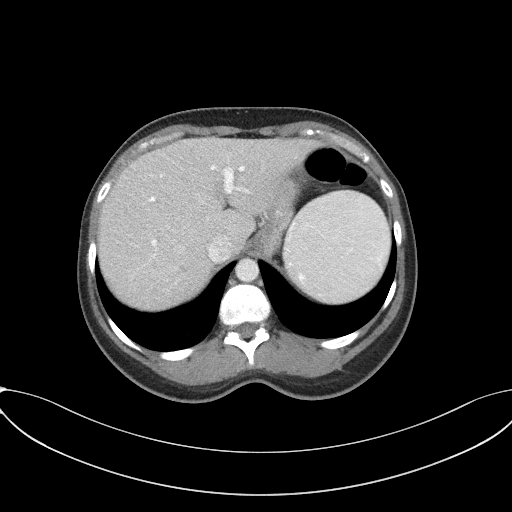
[im 81/86  soft-tissue]
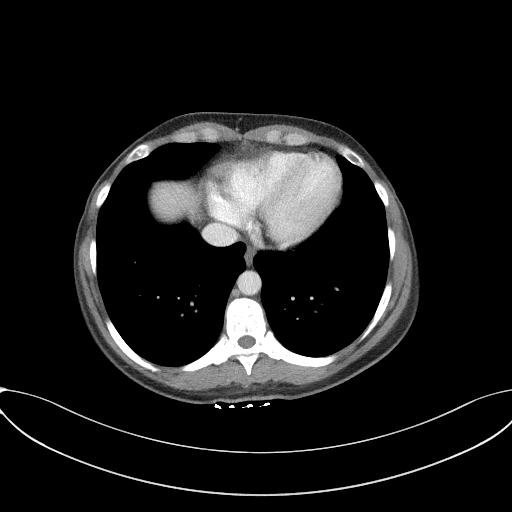

[Series 5: coronal st · coronal · 0.73mm/px · 3 of 101 slices shown]
[im 34/101  soft-tissue]
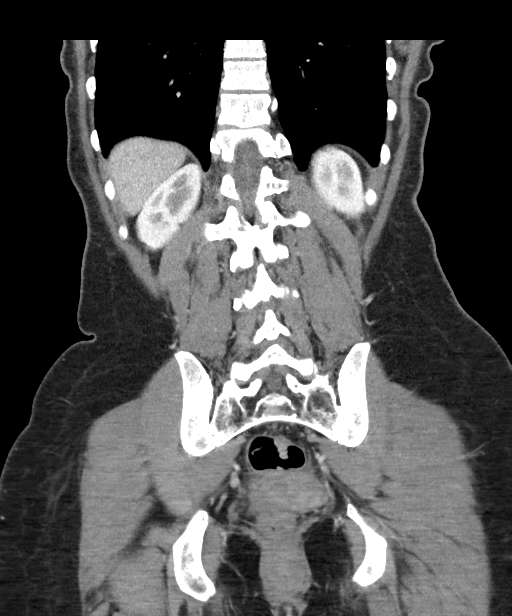
[im 45/101  soft-tissue]
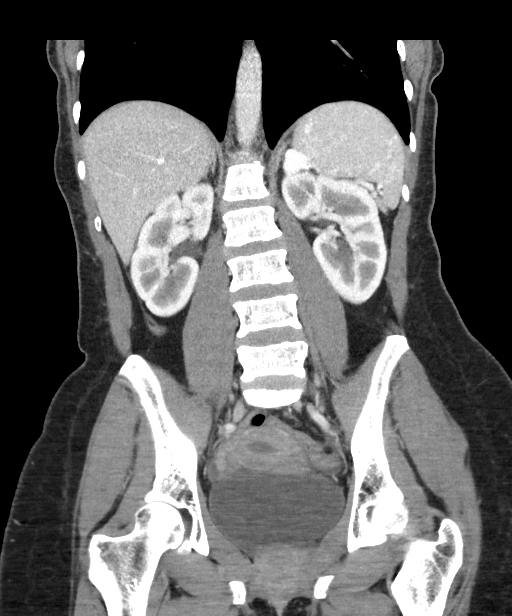
[im 56/101  soft-tissue]
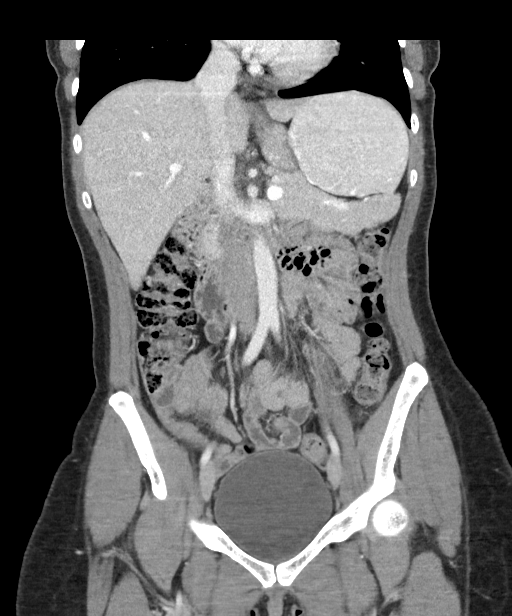

[15 of 46 positions shown; findings below may reference images not displayed]

RADIATION DOSE REDUCTION: This exam was performed according to the
departmental dose-optimization program which includes automated
exposure control, adjustment of the mA and/or kV according to
patient size and/or use of iterative reconstruction technique.

CONTRAST:  100mL OMNIPAQUE IOHEXOL 350 MG/ML SOLN
FINDINGS: Lower chest: No acute abnormality.

Hepatobiliary: Liver shows a normal enhancement pattern with the
exception of a small 12 mm hypodensity in the posterior aspect of
the right lobe of the liver best seen on image number 13 of series
2. Some peripheral enhancement is noted in these changes are
suggestive of small hemangioma. Gallbladder is unremarkable.
Previously seen hyperdense lesion within the right lobe of the liver
is not well appreciated on today's exam.

Pancreas: Unremarkable. No pancreatic ductal dilatation or
surrounding inflammatory changes.

Spleen: Spleen again demonstrates a large hyperdense mass lesion
measuring up to 9.2 cm. This has increased in size from the prior
exam and demonstrates some peripheral increased enhancement likely
representing a large hemangioma. Prominence of the splenic vein is
noted which would support the diagnosis of hemangioma. The remainder
of the spleen is within normal limits.

Adrenals/Urinary Tract: Adrenal glands are within normal limits.
Kidneys demonstrate a normal enhancement pattern. No renal calculi
or obstructive changes are noted. Normal excretion is noted on
delayed images. The bladder is well distended.

Stomach/Bowel: Colon shows no obstructive or inflammatory changes.
The appendix is well visualized and within normal limits. Small
bowel and stomach appear within normal limits as well.

Vascular/Lymphatic: Circumaortic left renal vein is noted. No
atherosclerotic calcifications are seen. No lymphadenopathy is
noted.

Reproductive: Enhancing uterine fibroids are noted within the
uterus. No adnexal mass is noted.

Other: No abdominal wall hernia or abnormality. No abdominopelvic
ascites.

Musculoskeletal: No acute or significant osseous findings.
IMPRESSION: Large splenic mass measuring up to 9.2 cm which has increased in
size when compared with the prior exam of 8331 but again
demonstrates findings suggestive of hemangioma.

Small hypodensity within the liver likely representing a small
hemangioma.

No other focal abnormality is noted.
